# Patient Record
Sex: Male | Born: 1960 | Race: White | Hispanic: No | Marital: Married | Smoking: Former smoker
Health system: Southern US, Community
[De-identification: ages and names within clinical notes are randomized; demographics above are authoritative.]

## PROBLEM LIST (undated history)

## (undated) DIAGNOSIS — E7800 Pure hypercholesterolemia, unspecified: Secondary | ICD-10-CM

## (undated) DIAGNOSIS — I10 Essential (primary) hypertension: Secondary | ICD-10-CM

## (undated) DIAGNOSIS — R7303 Prediabetes: Secondary | ICD-10-CM

---

## 2002-11-27 ENCOUNTER — Emergency Department (HOSPITAL_COMMUNITY): Admission: EM | Admit: 2002-11-27 | Discharge: 2002-11-27 | Payer: Self-pay | Admitting: *Deleted

## 2003-07-05 ENCOUNTER — Emergency Department (HOSPITAL_COMMUNITY): Admission: AC | Admit: 2003-07-05 | Discharge: 2003-07-05 | Payer: Self-pay

## 2003-07-19 ENCOUNTER — Ambulatory Visit (HOSPITAL_COMMUNITY): Admission: RE | Admit: 2003-07-19 | Discharge: 2003-07-19 | Payer: Self-pay | Admitting: Internal Medicine

## 2007-04-02 ENCOUNTER — Emergency Department (HOSPITAL_COMMUNITY): Admission: EM | Admit: 2007-04-02 | Discharge: 2007-04-02 | Payer: Self-pay | Admitting: Family Medicine

## 2007-08-19 ENCOUNTER — Encounter: Admission: RE | Admit: 2007-08-19 | Discharge: 2007-08-19 | Payer: Self-pay | Admitting: Internal Medicine

## 2013-09-16 ENCOUNTER — Other Ambulatory Visit: Payer: Self-pay | Admitting: Internal Medicine

## 2013-09-16 ENCOUNTER — Ambulatory Visit
Admission: RE | Admit: 2013-09-16 | Discharge: 2013-09-16 | Disposition: A | Discharge status: Home / Self Care | Payer: BC Managed Care – PPO | Source: Ambulatory Visit | Attending: Internal Medicine | Admitting: Internal Medicine

## 2013-09-16 DIAGNOSIS — R52 Pain, unspecified: Secondary | ICD-10-CM

## 2013-09-16 MED ORDER — IOHEXOL 300 MG/ML  SOLN
125.0000 mL | Freq: Once | INTRAMUSCULAR | Status: AC | PRN
Start: 2013-09-16 — End: 2013-09-16
  Administered 2013-09-16: 125 mL via INTRAVENOUS

## 2013-09-16 MED ORDER — IOHEXOL 300 MG/ML  SOLN
125.0000 mL | Freq: Once | INTRAMUSCULAR | Status: AC | PRN
  Administered 2013-09-16: 125 mL via INTRAVENOUS

## 2017-04-22 ENCOUNTER — Other Ambulatory Visit (HOSPITAL_COMMUNITY): Payer: Self-pay | Admitting: Nurse Practitioner

## 2017-04-22 DIAGNOSIS — R1084 Generalized abdominal pain: Secondary | ICD-10-CM

## 2017-04-24 ENCOUNTER — Ambulatory Visit (HOSPITAL_COMMUNITY)
Admission: RE | Admit: 2017-04-24 | Discharge: 2017-04-24 | Disposition: A | Discharge status: Home / Self Care | Payer: BLUE CROSS/BLUE SHIELD | Source: Ambulatory Visit | Attending: Nurse Practitioner | Admitting: Nurse Practitioner

## 2017-04-24 DIAGNOSIS — N281 Cyst of kidney, acquired: Secondary | ICD-10-CM | POA: Diagnosis not present

## 2017-04-24 DIAGNOSIS — R1084 Generalized abdominal pain: Secondary | ICD-10-CM

## 2017-04-24 DIAGNOSIS — R109 Unspecified abdominal pain: Secondary | ICD-10-CM | POA: Diagnosis present

## 2017-04-24 DIAGNOSIS — K760 Fatty (change of) liver, not elsewhere classified: Secondary | ICD-10-CM | POA: Diagnosis not present

## 2018-07-19 ENCOUNTER — Other Ambulatory Visit (HOSPITAL_COMMUNITY): Payer: Self-pay | Admitting: Nurse Practitioner

## 2018-07-19 ENCOUNTER — Ambulatory Visit (HOSPITAL_COMMUNITY)
Admission: RE | Admit: 2018-07-19 | Discharge: 2018-07-19 | Disposition: A | Discharge status: Home / Self Care | Payer: BLUE CROSS/BLUE SHIELD | Source: Ambulatory Visit | Attending: Nurse Practitioner | Admitting: Nurse Practitioner

## 2018-07-19 DIAGNOSIS — R0602 Shortness of breath: Secondary | ICD-10-CM

## 2018-07-19 DIAGNOSIS — R05 Cough: Secondary | ICD-10-CM | POA: Insufficient documentation

## 2018-07-19 DIAGNOSIS — R059 Cough, unspecified: Secondary | ICD-10-CM

## 2020-12-16 ENCOUNTER — Emergency Department (HOSPITAL_COMMUNITY): Payer: BC Managed Care – PPO

## 2020-12-16 ENCOUNTER — Other Ambulatory Visit: Payer: Self-pay

## 2020-12-16 ENCOUNTER — Emergency Department (HOSPITAL_COMMUNITY)
Admission: EM | Admit: 2020-12-16 | Discharge: 2020-12-16 | Disposition: A | Discharge status: Home / Self Care | Payer: BC Managed Care – PPO | Attending: Emergency Medicine | Admitting: Emergency Medicine

## 2020-12-16 ENCOUNTER — Encounter (HOSPITAL_COMMUNITY): Payer: Self-pay | Admitting: Emergency Medicine

## 2020-12-16 ENCOUNTER — Encounter: Payer: Self-pay | Admitting: Emergency Medicine

## 2020-12-16 DIAGNOSIS — R109 Unspecified abdominal pain: Secondary | ICD-10-CM | POA: Diagnosis present

## 2020-12-16 DIAGNOSIS — M25511 Pain in right shoulder: Secondary | ICD-10-CM | POA: Insufficient documentation

## 2020-12-16 DIAGNOSIS — R7303 Prediabetes: Secondary | ICD-10-CM | POA: Insufficient documentation

## 2020-12-16 DIAGNOSIS — I10 Essential (primary) hypertension: Secondary | ICD-10-CM | POA: Insufficient documentation

## 2020-12-16 DIAGNOSIS — Z87891 Personal history of nicotine dependence: Secondary | ICD-10-CM | POA: Diagnosis not present

## 2020-12-16 DIAGNOSIS — R0789 Other chest pain: Secondary | ICD-10-CM | POA: Diagnosis not present

## 2020-12-16 DIAGNOSIS — J181 Lobar pneumonia, unspecified organism: Secondary | ICD-10-CM | POA: Diagnosis not present

## 2020-12-16 DIAGNOSIS — J189 Pneumonia, unspecified organism: Secondary | ICD-10-CM

## 2020-12-16 HISTORY — DX: Essential (primary) hypertension: I10

## 2020-12-16 HISTORY — DX: Pure hypercholesterolemia, unspecified: E78.00

## 2020-12-16 HISTORY — DX: Prediabetes: R73.03

## 2020-12-16 LAB — CBC WITH DIFFERENTIAL/PLATELET
Abs Immature Granulocytes: 0.04 10*3/uL (ref 0.00–0.07)
Basophils Absolute: 0.1 10*3/uL (ref 0.0–0.1)
Basophils Relative: 1 %
Eosinophils Absolute: 0.2 10*3/uL (ref 0.0–0.5)
Eosinophils Relative: 2 %
HCT: 47.8 % (ref 39.0–52.0)
Hemoglobin: 16.1 g/dL (ref 13.0–17.0)
Immature Granulocytes: 0 %
Lymphocytes Relative: 19 %
Lymphs Abs: 2.4 10*3/uL (ref 0.7–4.0)
MCH: 30.5 pg (ref 26.0–34.0)
MCHC: 33.7 g/dL (ref 30.0–36.0)
MCV: 90.5 fL (ref 80.0–100.0)
Monocytes Absolute: 1.1 10*3/uL — ABNORMAL HIGH (ref 0.1–1.0)
Monocytes Relative: 9 %
Neutro Abs: 8.8 10*3/uL — ABNORMAL HIGH (ref 1.7–7.7)
Neutrophils Relative %: 69 %
Platelets: 237 10*3/uL (ref 150–400)
RBC: 5.28 MIL/uL (ref 4.22–5.81)
RDW: 13.5 % (ref 11.5–15.5)
WBC: 12.6 10*3/uL — ABNORMAL HIGH (ref 4.0–10.5)
nRBC: 0 % (ref 0.0–0.2)

## 2020-12-16 LAB — COMPREHENSIVE METABOLIC PANEL
ALT: 29 U/L (ref 0–44)
AST: 28 U/L (ref 15–41)
Albumin: 4.7 g/dL (ref 3.5–5.0)
Alkaline Phosphatase: 73 U/L (ref 38–126)
Anion gap: 9 (ref 5–15)
BUN: 12 mg/dL (ref 6–20)
CO2: 27 mmol/L (ref 22–32)
Calcium: 8.8 mg/dL — ABNORMAL LOW (ref 8.9–10.3)
Chloride: 102 mmol/L (ref 98–111)
Creatinine, Ser: 0.91 mg/dL (ref 0.61–1.24)
GFR, Estimated: >60 mL/min (ref >60)
Glucose, Bld: 135 mg/dL — ABNORMAL HIGH (ref 70–99)
Potassium: 3.5 mmol/L (ref 3.5–5.1)
Sodium: 138 mmol/L (ref 135–145)
Total Bilirubin: 0.9 mg/dL (ref 0.3–1.2)
Total Protein: 8.7 g/dL — ABNORMAL HIGH (ref 6.5–8.1)

## 2020-12-16 LAB — URINALYSIS, ROUTINE W REFLEX MICROSCOPIC
Bacteria, UA: NONE SEEN
Bilirubin Urine: NEGATIVE
Glucose, UA: NEGATIVE mg/dL
Ketones, ur: 20 mg/dL — AB
Leukocytes,Ua: NEGATIVE
Nitrite: NEGATIVE
Protein, ur: NEGATIVE mg/dL
Specific Gravity, Urine: 1.039 — ABNORMAL HIGH (ref 1.005–1.030)
pH: 6 (ref 5.0–8.0)

## 2020-12-16 LAB — TROPONIN I (HIGH SENSITIVITY): Troponin I (High Sensitivity): 3 ng/L (ref <18)

## 2020-12-16 LAB — LIPASE, BLOOD: Lipase: 58 U/L — ABNORMAL HIGH (ref 11–51)

## 2020-12-16 MED ORDER — HYDROCODONE-ACETAMINOPHEN 5-325 MG PO TABS
2.0000 | ORAL_TABLET | Freq: Once | ORAL | Status: AC
  Administered 2020-12-16: 2 via ORAL
  Filled 2020-12-16: qty 2

## 2020-12-16 MED ORDER — DOXYCYCLINE HYCLATE 100 MG PO TABS
100.0000 mg | ORAL_TABLET | Freq: Once | ORAL | Status: AC
  Administered 2020-12-16: 100 mg via ORAL
  Filled 2020-12-16: qty 1

## 2020-12-16 MED ORDER — DOXYCYCLINE HYCLATE 100 MG PO CAPS: 100.0000 mg | ORAL_CAPSULE | Freq: Two times a day (BID) | ORAL | 0 refills | Status: AC

## 2020-12-16 MED ORDER — HYDROMORPHONE HCL 1 MG/ML IJ SOLN
1.0000 mg | Freq: Once | INTRAMUSCULAR | Status: AC
  Administered 2020-12-16: 1 mg via INTRAVENOUS
  Filled 2020-12-16: qty 1

## 2020-12-16 MED ORDER — IOHEXOL 350 MG/ML SOLN
100.0000 mL | Freq: Once | INTRAVENOUS | Status: AC | PRN
  Administered 2020-12-16: 100 mL via INTRAVENOUS

## 2020-12-16 MED ORDER — NAPROXEN 500 MG PO TABS: 500.0000 mg | ORAL_TABLET | Freq: Two times a day (BID) | ORAL | 0 refills | Status: AC

## 2020-12-16 MED ORDER — SODIUM CHLORIDE 0.9 % IV SOLN: INTRAVENOUS | Status: DC

## 2020-12-16 MED ORDER — ONDANSETRON 4 MG PO TBDP
4.0000 mg | ORAL_TABLET | Freq: Once | ORAL | Status: AC
  Administered 2020-12-16: 4 mg via ORAL
  Filled 2020-12-16: qty 1

## 2020-12-16 NOTE — ED Triage Notes (Signed)
Pt c/o pain that wraps around right side to right chest and up to his shoulder; reports worsening with inhalation, pain x several days but worse since 0130 this am

## 2020-12-16 NOTE — ED Notes (Signed)
Pt c/o of continued pain getting worse than when he arrived. EDP notified. New orders to be placed prior to dc

## 2020-12-16 NOTE — ED Provider Notes (Signed)
[facility]ERGENCY [facility]PARTMENT Provider Note   CSN: [id number] Arrival date & time: 12/16/20  0732     History Chief Complaint  Patient presents with   Flank Pain    Clarita CraneHenry M Reeder is a 60 y.o. male.   Flank Pain   This patient is a 60 year old male, he endorses having a history of hypertension and prediabetes, he cannot recall the name of his medications offhand.  He started a new cholesterol medication about a month ago.  Over the last week the patient has had a progressive pain in his right side, this started in the right lateral mid abdomen and since that time has started to cause increasing pain and in fact the pain gets much worse when he tries to take a deep breath.  When he tries to push on his abdomen.  It is now radiating up into his right shoulder.  There is no coughing no fever no swelling of the legs.  He has no history of heart disease, he does not drink alcohol or smoke cigarettes and has never had pancreatitis or any abdominal surgery.  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  Denies swelling of the legs.  He reports he had 2 pieces of pizza last night this did not make the pain worse, it has been constant and is now 10 out of 10 and severe in his description  Past Medical History:  Diagnosis Date   Hypercholesteremia    Hypertension    Pre-diabetes     Patient Active Problem List   Diagnosis Date Noted   Pre-diabetes 12/16/2020     The histories are not reviewed yet. Please review them in the "History" navigator section and refresh this SmartLink.     No family history on file.  Social History   Tobacco Use   Smoking status: Former    Types: Cigarettes   Smokeless tobacco: Never  Substance Use Topics   Alcohol use: Not Currently   Drug use: Not Currently    Home Medications Prior to Admission medications   Medication Sig Start Date End Date Taking? Authorizing Provider  doxycycline (VIBRAMYCIN) 100 MG capsule Take 1 capsule (100 mg  total) by mouth 2 (two) times daily for 7 days. 12/16/20 12/23/20 Yes Eber HongMiller, Gaynel Schaafsma, MD  naproxen (NAPROSYN) 500 MG tablet Take 1 tablet (500 mg total) by mouth 2 (two) times daily with a meal. 12/16/20  Yes Eber HongMiller, Sofia Jaquith, MD    Allergies    Penicillins  Review of Systems   Review of Systems  Genitourinary:  Positive for flank pain.  All other systems reviewed and are negative.  Physical Exam Updated Vital Signs BP (!) 146/85   Pulse 60   Temp 98.6 F (37 C) (Oral)   Resp (!) 23   Ht 1.88 m (6\' 2" )   Wt 115.7 kg   SpO2 94%   BMI 32.74 kg/m   Physical Exam Vitals and nursing note reviewed.  Constitutional:      General: He is not in acute distress.    Appearance: He is well-developed.  HENT:     Head: Normocephalic and atraumatic.     Mouth/Throat:     Pharynx: No oropharyngeal exudate.  Eyes:     General: No scleral icterus.       Right eye: No discharge.        Left eye: No discharge.     Conjunctiva/sclera: Conjunctivae normal.     Pupils: Pupils are equal, round, and reactive to light.  Neck:     Thyroid: No thyromegaly.     Vascular: No JVD.  Cardiovascular:     Rate and Rhythm: Normal rate and regular rhythm.     Heart sounds: Normal heart sounds. No murmur heard.   No friction rub. No gallop.  Pulmonary:     Effort: Pulmonary effort is normal. No respiratory distress.     Breath sounds: Normal breath sounds. No wheezing or rales.  Abdominal:     General: Bowel sounds are normal. There is no distension.     Palpations: Abdomen is soft. There is no mass.     Tenderness: There is abdominal tenderness.     Comments: Right upper quadrant tenderness with a positive Murphy sign  Musculoskeletal:        General: No tenderness. Normal range of motion.     Cervical back: Normal range of motion and neck supple.     Right lower leg: No edema.     Left lower leg: No edema.  Lymphadenopathy:     Cervical: No cervical adenopathy.  Skin:    General: Skin is warm and  dry.     Findings: No erythema or rash.  Neurological:     Mental Status: He is alert.     Coordination: Coordination normal.  Psychiatric:        Behavior: Behavior normal.    ED Results / Procedures / Treatments   Labs (all labs ordered are listed, but only abnormal results are displayed) Labs Reviewed  COMPREHENSIVE METABOLIC PANEL - Abnormal; Notable for the following components:      Result Value   Glucose, Bld 135 (*)    Calcium 8.8 (*)    Total Protein 8.7 (*)    All other components within normal limits  CBC WITH DIFFERENTIAL/PLATELET - Abnormal; Notable for the following components:   WBC 12.6 (*)    Neutro Abs 8.8 (*)    Monocytes Absolute 1.1 (*)    All other components within normal limits  LIPASE, BLOOD - Abnormal; Notable for the following components:   Lipase 58 (*)    All other components within normal limits  URINALYSIS, ROUTINE W REFLEX MICROSCOPIC - Abnormal; Notable for the following components:   Specific Gravity, Urine 1.039 (*)    Hgb urine dipstick SMALL (*)    Ketones, ur 20 (*)    All other components within normal limits  TROPONIN I (HIGH SENSITIVITY)    EKG EKG Interpretation  Date/Time:  Sunday December 16 2020 07:44:01 EDT Ventricular Rate:  59 PR Interval:  150 QRS Duration: 112 QT Interval:  426 QTC Calculation: 422 R Axis:   78 Text Interpretation: Sinus rhythm Borderline intraventricular conduction delay Borderline repolarization abnormality No old tracing to compare Confirmed by Eber Hong ([id number]) on 12/16/2020 7:53:23 AM  Radiology CT Angio Chest PE W and/or Wo Contrast  Result Date: 12/16/2020 CLINICAL DATA:  Right abdominal pain, right chest pain, high prob PE EXAM: CT ANGIOGRAPHY CHEST CT ABDOMEN AND PELVIS WITH CONTRAST TECHNIQUE: Multidetector CT imaging of the chest was performed using the standard protocol during bolus administration of intravenous contrast. Multiplanar CT image reconstructions and MIPs were obtained to  evaluate the vascular anatomy. Multidetector CT imaging of the abdomen and pelvis was performed using the standard protocol during bolus administration of intravenous contrast. CONTRAST:  OMNIPAQUE IOHEXOL 350 MG/ML SOLN COMPARISON:  09/16/2013 and previous FINDINGS: CTA CHEST FINDINGS Cardiovascular: Heart size upper limits normal. No pericardial effusion. The RV is nondilated. Satisfactory opacification  of pulmonary arteries noted, and there is no evidence of pulmonary emboli. Coronary calcifications. Adequate contrast opacification of the thoracic aorta with no evidence of dissection, aneurysm, or stenosis. There is bovine variant brachiocephalic arch anatomy without proximal stenosis. Minimal atherosclerotic change. Mediastinum/Nodes: No mass or adenopathy. Lungs/Pleura: No pleural effusion. No pneumothorax. Peripheral subsegmental consolidation/atelectasis in the posterior and lateral basal segments right lower lobe. Musculoskeletal: Anterior vertebral endplate spurring at multiple levels in the mid and lower thoracic spine. Review of the MIP images confirms the above findings. CT ABDOMEN and PELVIS FINDINGS Hepatobiliary: No focal liver abnormality is seen. No gallstones, gallbladder wall thickening, or biliary dilatation. Pancreas: Unremarkable. No pancreatic ductal dilatation or surrounding inflammatory changes. Spleen: Normal in size without focal abnormality. Adrenals/Urinary Tract: Adrenal glands unremarkable. Small bilateral renal lesions probably cysts but too small to accurately characterize, largest 1.7 cm mid left kidney. No urolithiasis or hydronephrosis. Urinary bladder is incompletely distended. Stomach/Bowel: Stomach is partially distended, unremarkable. Small bowel is decompressed. Normal appendix. The colon is nondilated, unremarkable. Vascular/Lymphatic: Mild aortoiliac scattered calcified plaque without aneurysm or evident stenosis. Portal vein patent. No abdominal or pelvic adenopathy.  Reproductive: Prostate is unremarkable. Other: Bilateral pelvic phleboliths.  No ascites.  No free air. Musculoskeletal: There are mild nonspecific inflammatory/edematous changes in the right retroperitoneum, inferior to the liver tip. Small paraumbilical hernia containing only mesenteric fat. Mild spondylitic changes in the lower lumbar spine. No fracture or worrisome bone lesion. Review of the MIP images confirms the above findings. IMPRESSION: 1. Negative for acute PE or thoracic aortic dissection. 2. Patchy peripheral airspace opacities at the right lung base possibly pneumonia. 3. Coronary and aortic Atherosclerosis (ICD10-170.0). Electronically Signed   By: Corlis Leak M.D.   On: 12/16/2020 09:23   CT ABDOMEN PELVIS W CONTRAST  Result Date: 12/16/2020 CLINICAL DATA:  Right abdominal pain, right chest pain, high prob PE EXAM: CT ANGIOGRAPHY CHEST CT ABDOMEN AND PELVIS WITH CONTRAST TECHNIQUE: Multidetector CT imaging of the chest was performed using the standard protocol during bolus administration of intravenous contrast. Multiplanar CT image reconstructions and MIPs were obtained to evaluate the vascular anatomy. Multidetector CT imaging of the abdomen and pelvis was performed using the standard protocol during bolus administration of intravenous contrast. CONTRAST:  OMNIPAQUE IOHEXOL 350 MG/ML SOLN COMPARISON:  09/16/2013 and previous FINDINGS: CTA CHEST FINDINGS Cardiovascular: Heart size upper limits normal. No pericardial effusion. The RV is nondilated. Satisfactory opacification of pulmonary arteries noted, and there is no evidence of pulmonary emboli. Coronary calcifications. Adequate contrast opacification of the thoracic aorta with no evidence of dissection, aneurysm, or stenosis. There is bovine variant brachiocephalic arch anatomy without proximal stenosis. Minimal atherosclerotic change. Mediastinum/Nodes: No mass or adenopathy. Lungs/Pleura: No pleural effusion. No pneumothorax.  Peripheral subsegmental consolidation/atelectasis in the posterior and lateral basal segments right lower lobe. Musculoskeletal: Anterior vertebral endplate spurring at multiple levels in the mid and lower thoracic spine. Review of the MIP images confirms the above findings. CT ABDOMEN and PELVIS FINDINGS Hepatobiliary: No focal liver abnormality is seen. No gallstones, gallbladder wall thickening, or biliary dilatation. Pancreas: Unremarkable. No pancreatic ductal dilatation or surrounding inflammatory changes. Spleen: Normal in size without focal abnormality. Adrenals/Urinary Tract: Adrenal glands unremarkable. Small bilateral renal lesions probably cysts but too small to accurately characterize, largest 1.7 cm mid left kidney. No urolithiasis or hydronephrosis. Urinary bladder is incompletely distended. Stomach/Bowel: Stomach is partially distended, unremarkable. Small bowel is decompressed. Normal appendix. The colon is nondilated, unremarkable. Vascular/Lymphatic: Mild aortoiliac scattered calcified plaque without  aneurysm or evident stenosis. Portal vein patent. No abdominal or pelvic adenopathy. Reproductive: Prostate is unremarkable. Other: Bilateral pelvic phleboliths.  No ascites.  No free air. Musculoskeletal: There are mild nonspecific inflammatory/edematous changes in the right retroperitoneum, inferior to the liver tip. Small paraumbilical hernia containing only mesenteric fat. Mild spondylitic changes in the lower lumbar spine. No fracture or worrisome bone lesion. Review of the MIP images confirms the above findings. IMPRESSION: 1. Negative for acute PE or thoracic aortic dissection. 2. Patchy peripheral airspace opacities at the right lung base possibly pneumonia. 3. Coronary and aortic Atherosclerosis (ICD10-170.0). Electronically Signed   By: Corlis Leak M.D.   On: 12/16/2020 09:23   DG Chest Port 1 View  Result Date: 12/16/2020 CLINICAL DATA:  60 year old male with history of shortness of  breath and chest pain. EXAM: PORTABLE CHEST 1 VIEW COMPARISON:  Chest x-ray 07/19/2018. FINDINGS: Lung volumes are low. No consolidative airspace disease. No pleural effusions. No pneumothorax. No pulmonary nodule or mass noted. Pulmonary vasculature and the cardiomediastinal silhouette are within normal limits. IMPRESSION: 1. Low lung volumes without radiographic evidence of acute cardiopulmonary disease. Electronically Signed   By: Trudie Reed M.D.   On: 12/16/2020 08:41    Procedures Procedures   Medications Ordered in ED Medications  0.9 %  sodium chloride infusion ( Intravenous New Bag/Given 12/16/20 0802)  doxycycline (VIBRA-TABS) tablet 100 mg (has no administration in time range)  HYDROmorphone (DILAUDID) injection 1 mg (1 mg Intravenous Given 12/16/20 0756)  ondansetron (ZOFRAN-ODT) disintegrating tablet 4 mg (4 mg Oral Given 12/16/20 0756)  iohexol (OMNIPAQUE) 350 MG/ML injection 100 mL (100 mLs Intravenous Contrast Given 12/16/20 0848)    ED Course  I have reviewed the triage vital signs and the nursing notes.  Pertinent labs & imaging results that were available during my care of the patient were reviewed by me and considered in my medical decision making (see chart for details).    MDM Rules/Calculators/A&P                          The patient has no jaundice however he does appear very uncomfortable and when I palpate his right upper quadrant he has significant and severe tenderness.  There is no other tenderness in the abdomen which is very soft.  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  That being said oxygen level is 98%, heart rate is around 60 bpm (he is on a beta-blocker).  EKG performed and shows no signs of acute ischemia.  Proceed with labs and CT scans.  This includes a CT angiogram of the chest and a CT abdomen and pelvis with contrast to look for signs of  gallbladder disease, ultrasound not available at this time  CT scans are unremarkable, there is no signs of pulmonary embolism or abdominal pathology, labs are supportive of this as well.  There is a slight leukocytosis in the presence of a possible pneumonia at the bottom of the lung so we will start antibiotics.  The patient is agreeable, vital signs are stable, afebrile, pulse in the 60s and normal blood pressure.  He is agreeable to the plan and has been informed of results.  Final Clinical Impression(s) / ED Diagnoses Final diagnoses:  Community acquired pneumonia of right lower lobe of lung    Rx / DC Orders ED Discharge Orders  Ordered    doxycycline (VIBRAMYCIN) 100 MG capsule  2 times daily        12/16/20 1120    naproxen (NAPROSYN) 500 MG tablet  2 times daily with meals        12/16/20 1120             Eber Hong, MD 12/16/20 1121

## 2020-12-16 NOTE — Discharge Instructions (Addendum)
Your testing is normal other than the possible early pneumonia in your left lung No signs of liver or gall bladder dysfunction  Doxycycline 100mg  by mouth twice daily until the medicine is completely finished - this medicine is a strong antibiotic that treats certain infections.    Please take Naprosyn, 500mg  by mouth twice daily as needed for pain - this in an antiinflammatory medicine (NSAID) and is similar to ibuprofen - many people feel that it is stronger than ibuprofen and it is easier to take since it is a smaller pill.  Please use this only for 1 week - if your pain persists, you will need to follow up with your doctor in the office for ongoing guidance and pain control.    Thank you for letting take care of you today!  Please obtain all of your results from medical records or have your doctors office obtain the results - share them with your doctor - you should be seen at your doctors office in the next 2 days. Call today to arrange your follow up. Take the medications as prescribed. Please review all of the medicines and only take them if you do not have an allergy to them. Please be aware that if you are taking birth control pills, taking other prescriptions, ESPECIALLY ANTIBIOTICS may make the birth control ineffective - if this is the case, either do not engage in sexual activity or use alternative methods of birth control such as condoms until you have finished the medicine and your family doctor says it is OK to restart them. If you are on a blood thinner such as COUMADIN, be aware that any other medicine that you take may cause the coumadin to either work too much, or not enough - you should have your coumadin level rechecked in next 7 days if this is the case.  ?  It is also a possibility that you have an allergic reaction to any of the medicines that you have been prescribed - Everybody reacts differently to medications and while MOST people have no trouble with most medicines, you  may have a reaction such as nausea, vomiting, rash, swelling, shortness of breath. If this is the case, please stop taking the medicine immediately and contact your physician.   If you were given a medication in the ED such as percocet, vicodin, or morphine, be aware that these medicines are sedating and may change your ability to take care of yourself adequately for several hours after being given this medicines - you should not drive or take care of small children if you were given this medicine in the Emergency Department or if you have been prescribed these types of medicines. ?   You should return to the ER IMMEDIATELY if you develop severe or worsening symptoms.

## 2023-04-07 IMAGING — DX DG CHEST 1V PORT
1 series · 1 of 1 positions shown · non-contrast
Comparison: Chest x-ray 07/19/2018.

CLINICAL DATA: 60-year-old male with history of shortness of breath
and chest pain.

EXAM:
PORTABLE CHEST 1 VIEW

[chest ap]
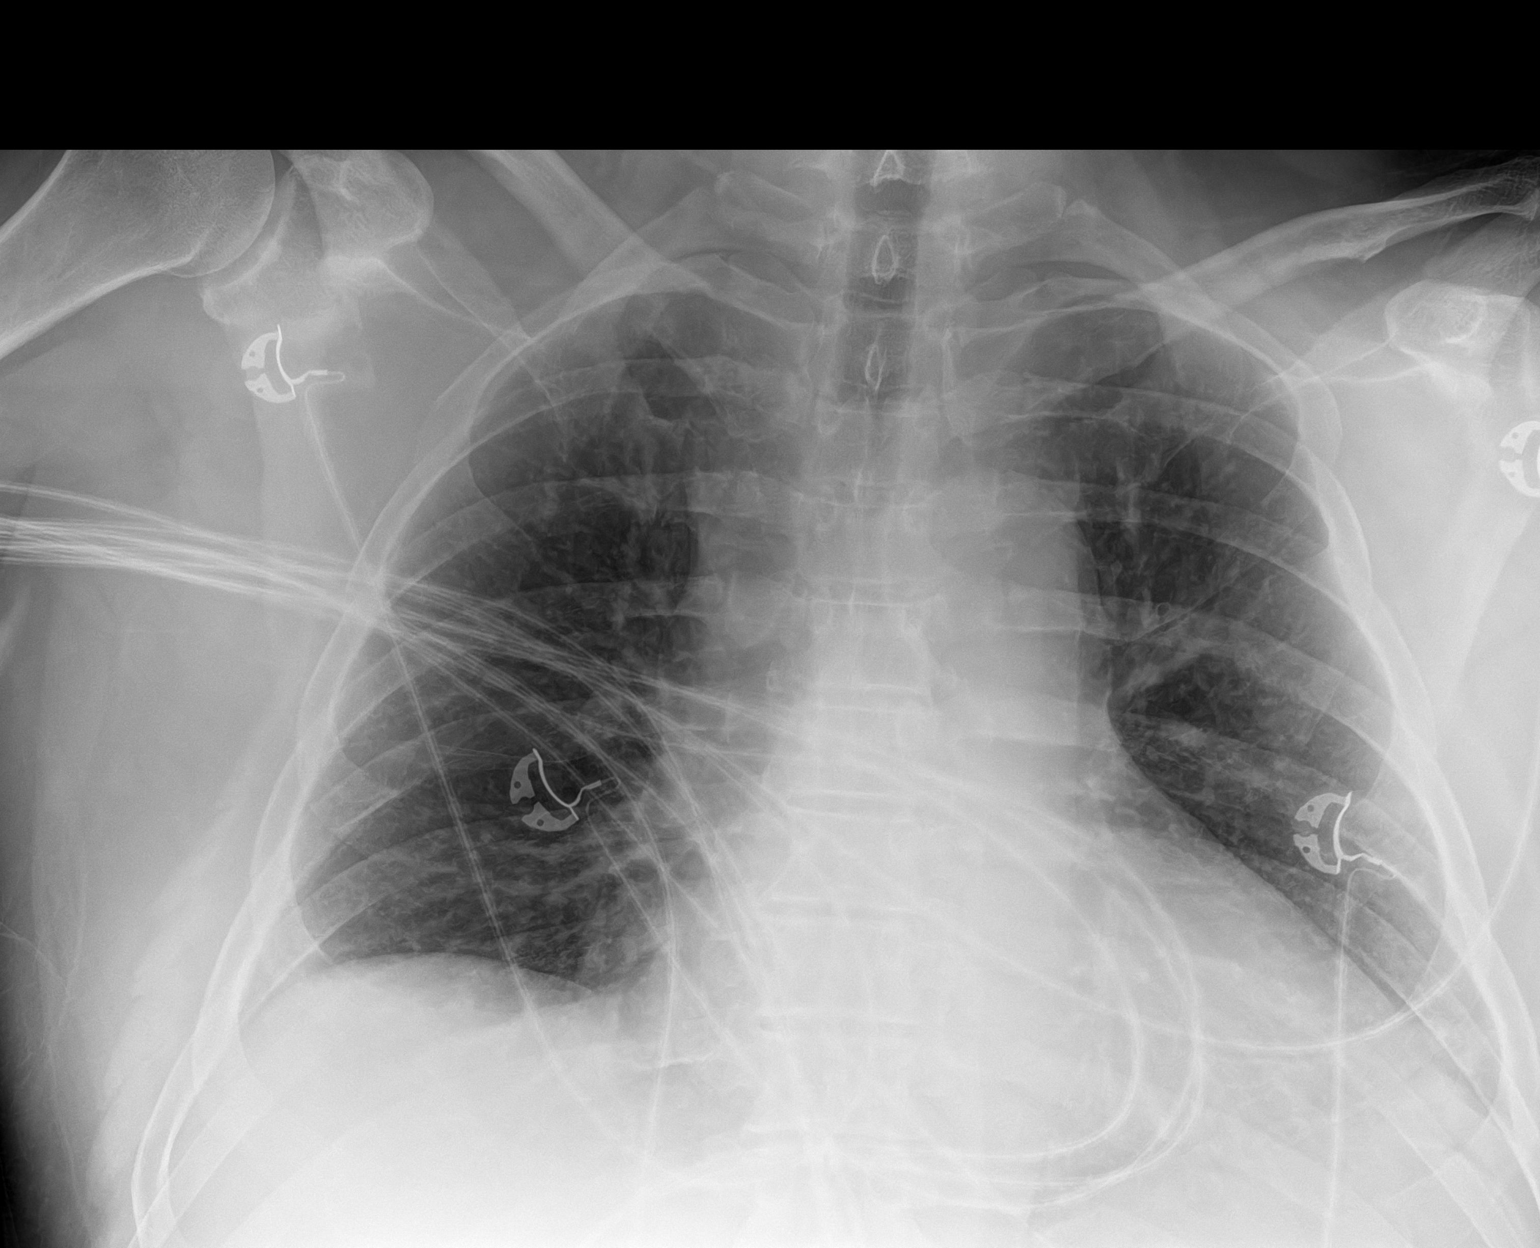

[1 of 1 positions shown; findings below may reference images not displayed]

FINDINGS: Lung volumes are low. No consolidative airspace disease. No pleural
effusions. No pneumothorax. No pulmonary nodule or mass noted.
Pulmonary vasculature and the cardiomediastinal silhouette are
within normal limits.
IMPRESSION: 1. Low lung volumes without radiographic evidence of acute
cardiopulmonary disease.

## 2023-04-07 IMAGING — CT CT ABD-PELV W/ CM
2 of 5 series · 15 of 46 positions shown, 17 images · IV contrast (Omnipaque or Isovue)
Comparison: 09/16/2013 and previous

CLINICAL DATA: Right abdominal pain, right chest pain, high prob PE

EXAM:
CT ANGIOGRAPHY CHEST
CT ABDOMEN AND PELVIS WITH CONTRAST
TECHNIQUE: Multidetector CT imaging of the chest was performed using the
standard protocol during bolus administration of intravenous
contrast. Multiplanar CT image reconstructions and MIPs were
obtained to evaluate the vascular anatomy. Multidetector CT imaging
of the abdomen and pelvis was performed using the standard protocol
during bolus administration of intravenous contrast.
CONTRAST:  100mL OMNIPAQUE IOHEXOL 350 MG/ML SOLN

[Series 2: abd/pel w/ · axial · 0.75mm/px · z∈[+926,+1376]mm · 12 of 106 slices shown, 14 images]
[im 8/106  soft-tissue]
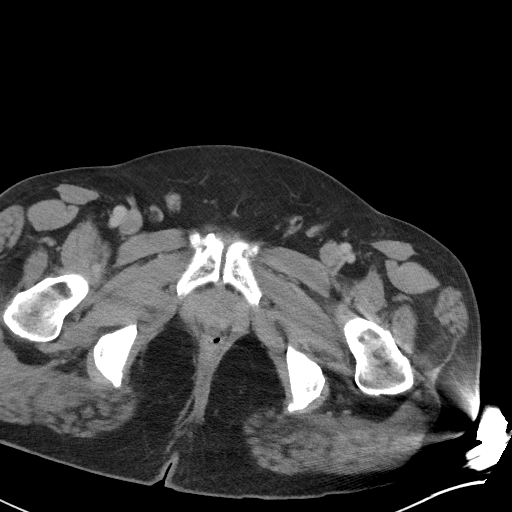
[im 8/106  bone]
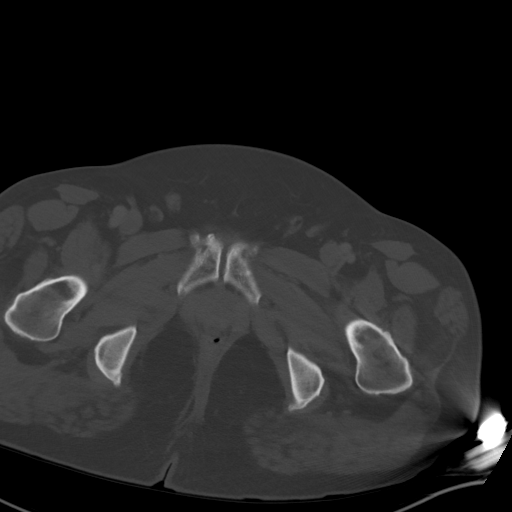
[im 16/106  soft-tissue]
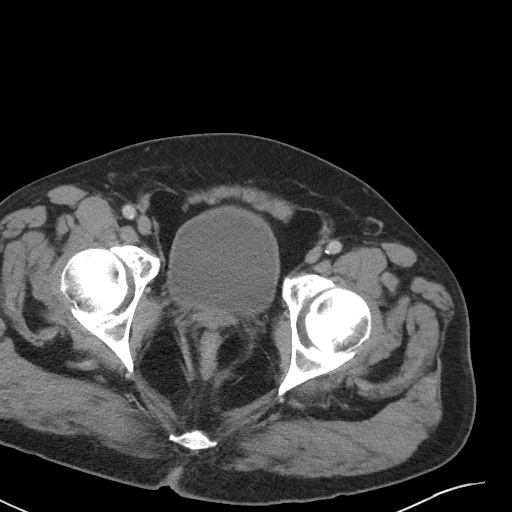
[im 23/106  soft-tissue]
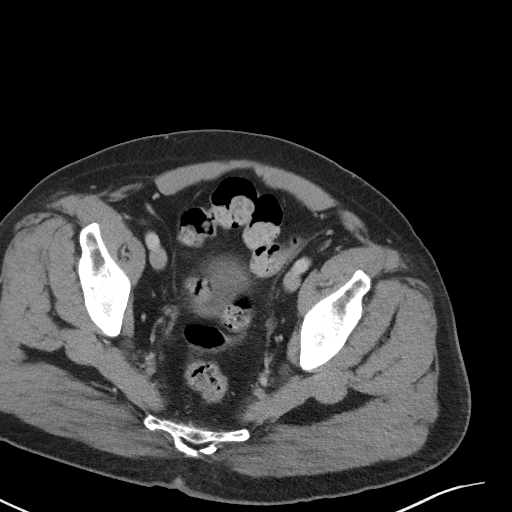
[im 31/106  soft-tissue]
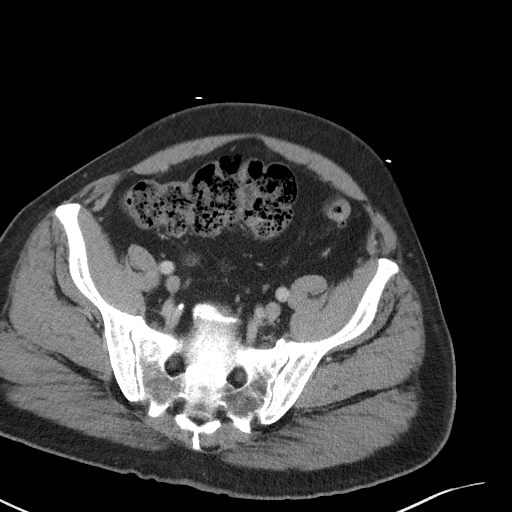
[im 38/106  soft-tissue]
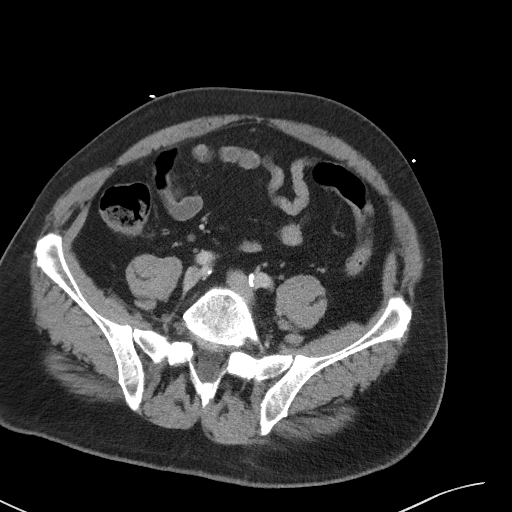
[im 46/106  soft-tissue]
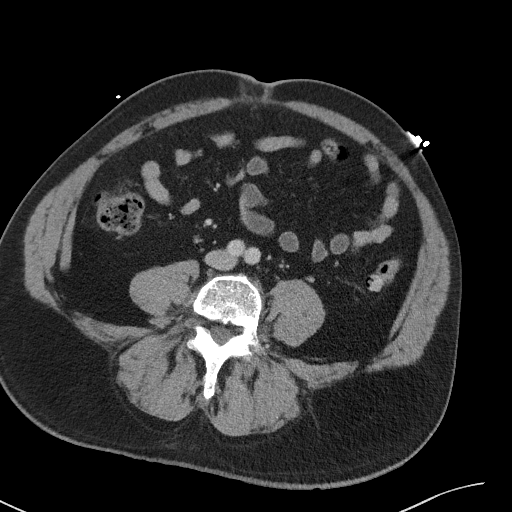
[im 61/106  soft-tissue]
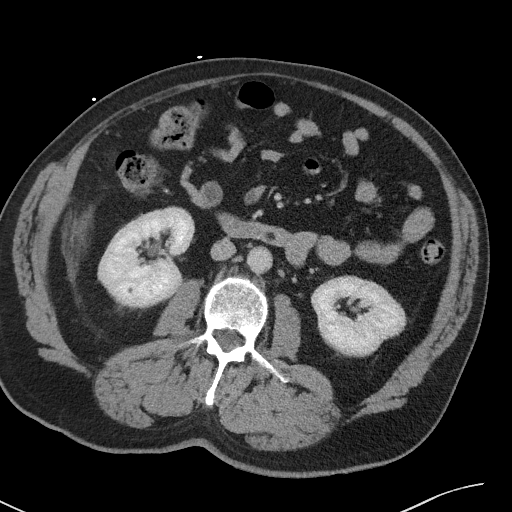
[im 68/106  soft-tissue]
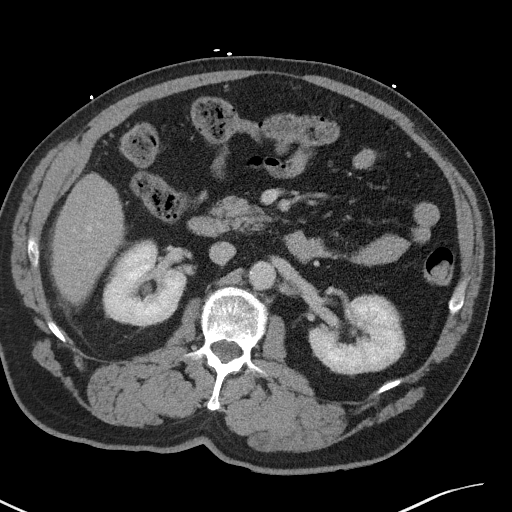
[im 76/106  soft-tissue]
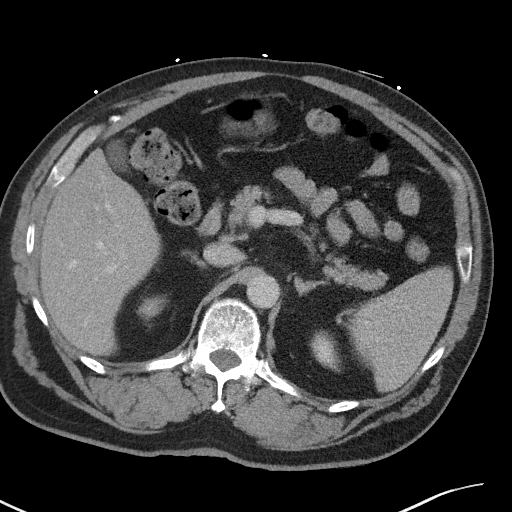
[im 76/106  bone]
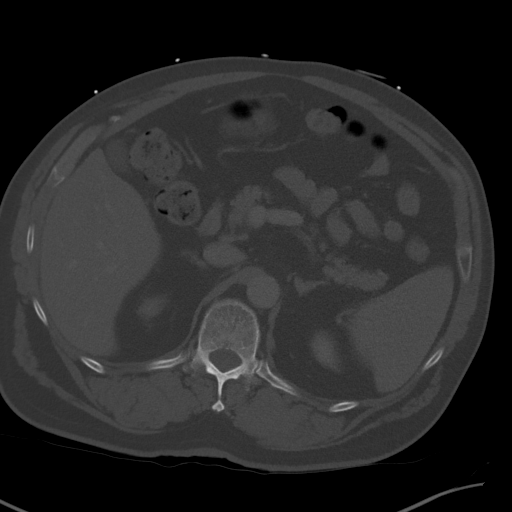
[im 83/106  soft-tissue]
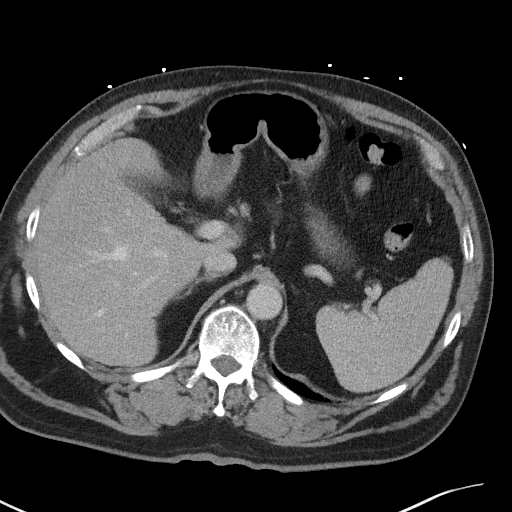
[im 91/106  soft-tissue]
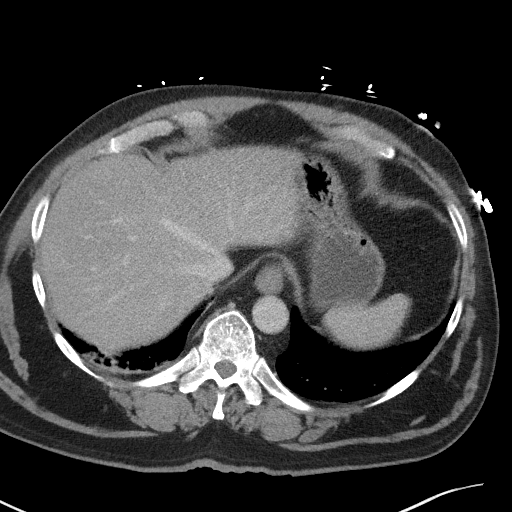
[im 98/106  soft-tissue]
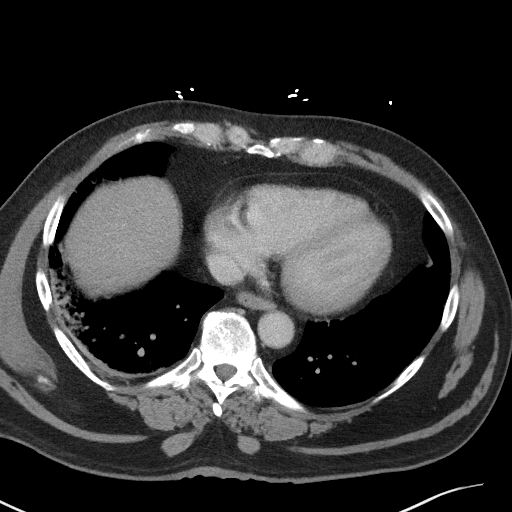

[Series 5: coronal · coronal · 0.78mm/px · 3 of 101 slices shown]
[im 34/101  soft-tissue]
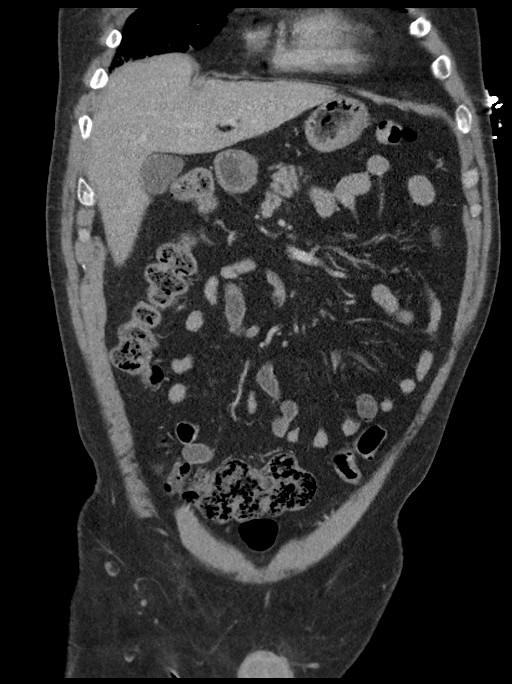
[im 45/101  soft-tissue]
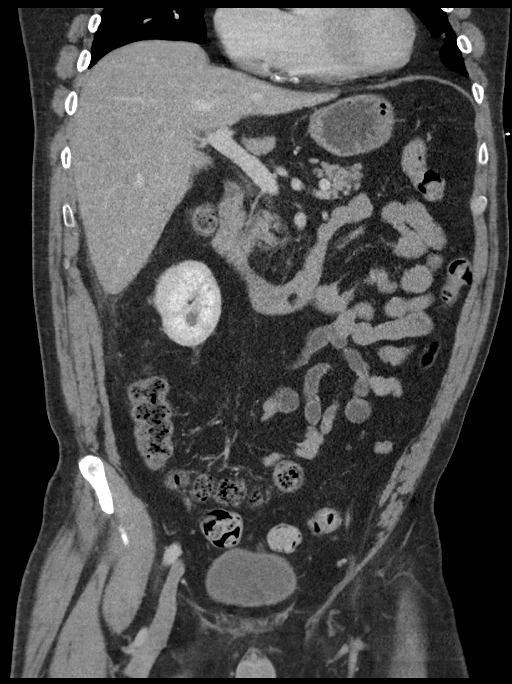
[im 56/101  soft-tissue]
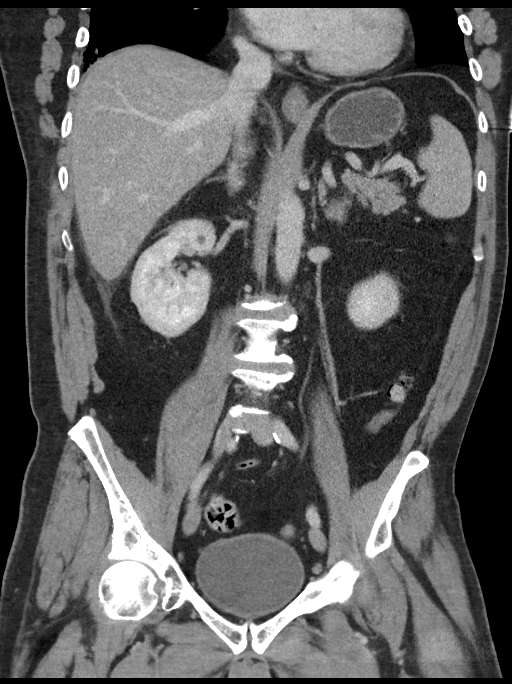

[15 of 46 positions shown; findings below may reference images not displayed]

FINDINGS: CTA CHEST FINDINGS

Cardiovascular: Heart size upper limits normal. No pericardial
effusion. The RV is nondilated. Satisfactory opacification of
pulmonary arteries noted, and there is no evidence of pulmonary
emboli. Coronary calcifications. Adequate contrast opacification of
the thoracic aorta with no evidence of dissection, aneurysm, or
stenosis. There is bovine variant brachiocephalic arch anatomy
without proximal stenosis. Minimal atherosclerotic change.

Mediastinum/Nodes: No mass or adenopathy.

Lungs/Pleura: No pleural effusion. No pneumothorax. Peripheral
subsegmental consolidation/atelectasis in the posterior and lateral
basal segments right lower lobe.

Musculoskeletal: Anterior vertebral endplate spurring at multiple
levels in the mid and lower thoracic spine.

Review of the MIP images confirms the above findings.

CT ABDOMEN and PELVIS FINDINGS

Hepatobiliary: No focal liver abnormality is seen. No gallstones,
gallbladder wall thickening, or biliary dilatation.

Pancreas: Unremarkable. No pancreatic ductal dilatation or
surrounding inflammatory changes.

Spleen: Normal in size without focal abnormality.

Adrenals/Urinary Tract: Adrenal glands unremarkable. Small bilateral
renal lesions probably cysts but too small to accurately
characterize, largest 1.7 cm mid left kidney. No urolithiasis or
hydronephrosis. Urinary bladder is incompletely distended.

Stomach/Bowel: Stomach is partially distended, unremarkable. Small
bowel is decompressed. Normal appendix. The colon is nondilated,
unremarkable.

Vascular/Lymphatic: Mild aortoiliac scattered calcified plaque
without aneurysm or evident stenosis. Portal vein patent. No
abdominal or pelvic adenopathy.

Reproductive: Prostate is unremarkable.

Other: Bilateral pelvic phleboliths.  No ascites.  No free air.

Musculoskeletal: There are mild nonspecific inflammatory/edematous
changes in the right retroperitoneum, inferior to the liver tip.
Small paraumbilical hernia containing only mesenteric fat. Mild
spondylitic changes in the lower lumbar spine. No fracture or
worrisome bone lesion.

Review of the MIP images confirms the above findings.
IMPRESSION: 1. Negative for acute PE or thoracic aortic dissection.
2. Patchy peripheral airspace opacities at the right lung base
possibly pneumonia.
3. Coronary and aortic Atherosclerosis ([id number]-170.0).

## 2023-11-12 ENCOUNTER — Encounter (INDEPENDENT_AMBULATORY_CARE_PROVIDER_SITE_OTHER): Payer: Self-pay | Admitting: *Deleted

## 2023-11-24 ENCOUNTER — Encounter (INDEPENDENT_AMBULATORY_CARE_PROVIDER_SITE_OTHER): Payer: Self-pay | Admitting: *Deleted

## 2024-05-25 ENCOUNTER — Encounter (INDEPENDENT_AMBULATORY_CARE_PROVIDER_SITE_OTHER): Payer: Self-pay | Admitting: *Deleted
